# Patient Record
Sex: Female | Born: 1990 | Race: White | Hispanic: No
Health system: Southern US, Community
[De-identification: ages and names within clinical notes are randomized; demographics above are authoritative.]

## PROBLEM LIST (undated history)

## (undated) HISTORY — PX: CHOLECYSTECTOMY: SHX55

---
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
## 2015-05-21 ENCOUNTER — Encounter (HOSPITAL_COMMUNITY): Payer: Self-pay

## 2015-05-21 ENCOUNTER — Emergency Department (HOSPITAL_COMMUNITY): Payer: BLUE CROSS/BLUE SHIELD

## 2015-05-21 ENCOUNTER — Emergency Department (HOSPITAL_COMMUNITY)
Admission: EM | Admit: 2015-05-21 | Discharge: 2015-05-21 | Disposition: A | Discharge status: Home / Self Care | Payer: BLUE CROSS/BLUE SHIELD | Attending: Emergency Medicine | Admitting: Emergency Medicine

## 2015-05-21 DIAGNOSIS — Y92410 Unspecified street and highway as the place of occurrence of the external cause: Secondary | ICD-10-CM | POA: Insufficient documentation

## 2015-05-21 DIAGNOSIS — S161XXA Strain of muscle, fascia and tendon at neck level, initial encounter: Secondary | ICD-10-CM

## 2015-05-21 DIAGNOSIS — S199XXA Unspecified injury of neck, initial encounter: Secondary | ICD-10-CM | POA: Diagnosis present

## 2015-05-21 DIAGNOSIS — S39012A Strain of muscle, fascia and tendon of lower back, initial encounter: Secondary | ICD-10-CM | POA: Diagnosis not present

## 2015-05-21 DIAGNOSIS — Y998 Other external cause status: Secondary | ICD-10-CM | POA: Diagnosis not present

## 2015-05-21 DIAGNOSIS — S0990XA Unspecified injury of head, initial encounter: Secondary | ICD-10-CM | POA: Insufficient documentation

## 2015-05-21 DIAGNOSIS — Z3202 Encounter for pregnancy test, result negative: Secondary | ICD-10-CM | POA: Diagnosis not present

## 2015-05-21 DIAGNOSIS — Y9389 Activity, other specified: Secondary | ICD-10-CM | POA: Insufficient documentation

## 2015-05-21 LAB — POC URINE PREG, ED: Preg Test, Ur: NEGATIVE

## 2015-05-21 MED ORDER — HYDROCODONE-ACETAMINOPHEN 5-325 MG PO TABS
1.0000 | ORAL_TABLET | Freq: Once | ORAL | Status: DC
  Filled 2015-05-21: qty 1

## 2015-05-21 MED ORDER — TRAMADOL HCL 50 MG PO TABS: 50.0000 mg | ORAL_TABLET | Freq: Four times a day (QID) | ORAL | Status: DC | PRN

## 2015-05-21 MED ORDER — TRAMADOL HCL 50 MG PO TABS
50.0000 mg | ORAL_TABLET | Freq: Once | ORAL | Status: AC
  Administered 2015-05-21: 50 mg via ORAL
  Filled 2015-05-21: qty 1

## 2015-05-21 NOTE — ED Notes (Signed)
Pt took robaxin  around 1300 today.

## 2015-05-21 NOTE — Discharge Instructions (Signed)
Cervical Sprain °A cervical sprain is when the tissues (ligaments) that hold the neck bones in place stretch or tear. °HOME CARE  °· Put ice on the injured area. °· Put ice in a plastic bag. °· Place a towel between your skin and the bag. °· Leave the ice on for 15-20 minutes, 3-4 times a day. °· You may have been given a collar to wear. This collar keeps your neck from moving while you heal. °· Do not take the collar off unless told by your doctor. °· If you have long hair, keep it outside of the collar. °· Ask your doctor before changing the position of your collar. You may need to change its position over time to make it more comfortable. °· If you are allowed to take off the collar for cleaning or bathing, follow your doctor's instructions on how to do it safely. °· Keep your collar clean by wiping it with mild soap and water. Dry it completely. If the collar has removable pads, remove them every 1-2 days to hand wash them with soap and water. Allow them to air dry. They should be dry before you wear them in the collar. °· Do not drive while wearing the collar. °· Only take medicine as told by your doctor. °· Keep all doctor visits as told. °· Keep all physical therapy visits as told. °· Adjust your work station so that you have good posture while you work. °· Avoid positions and activities that make your problems worse. °· Warm up and stretch before being active. °GET HELP IF: °· Your pain is not controlled with medicine. °· You cannot take less pain medicine over time as planned. °· Your activity level does not improve as expected. °GET HELP RIGHT AWAY IF:  °· You are bleeding. °· Your stomach is upset. °· You have an allergic reaction to your medicine. °· You develop new problems that you cannot explain. °· You lose feeling (become numb) or you cannot move any part of your body (paralysis). °· You have tingling or weakness in any part of your body. °· Your symptoms get worse. Symptoms include: °· Pain,  soreness, stiffness, puffiness (swelling), or a burning feeling in your neck. °· Pain when your neck is touched. °· Shoulder or upper back pain. °· Limited ability to move your neck. °· Headache. °· Dizziness. °· Your hands or arms feel week, lose feeling, or tingle. °· Muscle spasms. °· Difficulty swallowing or chewing. °MAKE SURE YOU:  °· Understand these instructions. °· Will watch your condition. °· Will get help right away if you are not doing well or get worse. °  °This information is not intended to replace advice given to you by your health care provider. Make sure you discuss any questions you have with your health care provider. °  °Document Released: 10/04/2007 Document Revised: 12/18/2012 Document Reviewed: 10/23/2012 °Elsevier Interactive Patient Education ©2016 [employer] ° °Lumbosacral Strain °Lumbosacral strain is a strain of any of the parts that make up your lumbosacral vertebrae. Your lumbosacral vertebrae are the bones that make up the lower third of your backbone. Your lumbosacral vertebrae are held together by muscles and tough, fibrous tissue (ligaments).  °CAUSES  °A sudden blow to your back can cause lumbosacral strain. Also, anything that causes an excessive stretch of the muscles in the low back can cause this strain. This is typically seen when people exert themselves strenuously, fall, lift heavy objects, bend, or crouch repeatedly. °RISK FACTORS °· Physically demanding   work. °· Participation in pushing or pulling sports or sports that require a sudden twist of the back (tennis, golf, baseball). °· Weight lifting. °· Excessive lower back curvature. °· Forward-tilted pelvis. °· Weak back or abdominal muscles or both. °· Tight hamstrings. °SIGNS AND SYMPTOMS  °Lumbosacral strain may cause pain in the area of your injury or pain that moves (radiates) down your leg.  °DIAGNOSIS °Your health care provider can often diagnose lumbosacral strain through a physical exam. In some cases, you may  need tests such as X-ray exams.  °TREATMENT  °Treatment for your lower back injury depends on many factors that your clinician will have to evaluate. However, most treatment will include the use of anti-inflammatory medicines. °HOME CARE INSTRUCTIONS  °· Avoid hard physical activities (tennis, racquetball, waterskiing) if you are not in proper physical condition for it. This may aggravate or create problems. °· If you have a back problem, avoid sports requiring sudden body movements. Swimming and walking are generally safer activities. °· Maintain good posture. °· Maintain a healthy weight. °· For acute conditions, you may put ice on the injured area. °¨ Put ice in a plastic bag. °¨ Place a towel between your skin and the bag. °¨ Leave the ice on for 20 minutes, 2-3 times a day. °· When the low back starts healing, stretching and strengthening exercises may be recommended. °SEEK MEDICAL CARE IF: °· Your back pain is getting worse. °· You experience severe back pain not relieved with medicines. °SEEK IMMEDIATE MEDICAL CARE IF:  °· You have numbness, tingling, weakness, or problems with the use of your arms or legs. °· There is a change in bowel or bladder control. °· You have increasing pain in any area of the body, including your belly (abdomen). °· You notice shortness of breath, dizziness, or feel faint. °· You feel sick to your stomach (nauseous), are throwing up (vomiting), or become sweaty. °· You notice discoloration of your toes or legs, or your feet get very cold. °MAKE SURE YOU:  °· Understand these instructions. °· Will watch your condition. °· Will get help right away if you are not doing well or get worse. °  °This information is not intended to replace advice given to you by your health care provider. Make sure you discuss any questions you have with your health care provider. °  °Document Released: 01/25/2005 Document Revised: 05/08/2014 Document Reviewed: 12/04/2012 °Elsevier Interactive Patient  Education ©2016 Else[employer] Inc. ° °
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
## 2016-02-19 ENCOUNTER — Emergency Department (HOSPITAL_COMMUNITY)
Admission: EM | Admit: 2016-02-19 | Discharge: 2016-02-19 | Disposition: A | Discharge status: Home / Self Care | Payer: BLUE CROSS/BLUE SHIELD | Attending: Emergency Medicine | Admitting: Emergency Medicine

## 2016-02-19 ENCOUNTER — Encounter (HOSPITAL_COMMUNITY): Payer: Self-pay | Admitting: Emergency Medicine

## 2016-02-19 DIAGNOSIS — T148XXA Other injury of unspecified body region, initial encounter: Secondary | ICD-10-CM

## 2016-02-19 DIAGNOSIS — Y9389 Activity, other specified: Secondary | ICD-10-CM | POA: Insufficient documentation

## 2016-02-19 DIAGNOSIS — Y929 Unspecified place or not applicable: Secondary | ICD-10-CM | POA: Diagnosis not present

## 2016-02-19 DIAGNOSIS — S39012A Strain of muscle, fascia and tendon of lower back, initial encounter: Secondary | ICD-10-CM | POA: Insufficient documentation

## 2016-02-19 DIAGNOSIS — S3992XA Unspecified injury of lower back, initial encounter: Secondary | ICD-10-CM | POA: Diagnosis present

## 2016-02-19 DIAGNOSIS — X501XXA Overexertion from prolonged static or awkward postures, initial encounter: Secondary | ICD-10-CM | POA: Insufficient documentation

## 2016-02-19 DIAGNOSIS — M62830 Muscle spasm of back: Secondary | ICD-10-CM

## 2016-02-19 DIAGNOSIS — Y999 Unspecified external cause status: Secondary | ICD-10-CM | POA: Diagnosis not present

## 2016-02-19 MED ORDER — CYCLOBENZAPRINE HCL 10 MG PO TABS: 10.0000 mg | ORAL_TABLET | Freq: Two times a day (BID) | ORAL | 0 refills | Status: AC | PRN

## 2016-02-19 NOTE — ED Provider Notes (Signed)
[facility] Provider Note   CSN: [id number] Arrival date & time: 02/19/16  1607     History   Chief Complaint Chief Complaint  Patient presents with  . Back Pain    HPI Sheila Aguilar is a 25 y.o. female who presents to the ED with back pain. 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 Patient went to [facility] in [place] last night and was told she had a sprain of her back. They did not do x-rays. They did not give her any medications. They did not give her a work note and she was unable to work today. Patient reports that she needs a work note for being out today. Patient currently taking Cipro for a UTI but no other medications at this time.  The history is provided Sheila the patient. No language interpreter was used.  Back Pain   This is a new problem. The current episode started yesterday. The problem occurs constantly. The problem has been gradually worsening. The pain is associated with a recent injury. The pain is present in the thoracic spine and lumbar spine. Quality: sharp. The pain is at a severity of 9/10. The symptoms are aggravated Sheila bending and twisting. The pain is the same all the time. Pertinent negatives include no chest pain, no fever, no numbness, no abdominal pain, no bowel incontinence, no bladder incontinence, no dysuria, no paresis and no weakness. She has tried nothing for the symptoms.    History reviewed. No pertinent past medical history.  There are no active problems to display for this patient.   Past Surgical History:  Procedure Laterality Date  . CHOLECYSTECTOMY      OB History    No data available       Home Medications    Prior to Admission medications   Medication Sig Start Date End Date Taking? Authorizing Provider    cyclobenzaprine (FLEXERIL) 10 MG tablet Take 1 tablet (10 mg total) Sheila mouth 2 (two) times daily as needed for muscle spasms. 02/19/16   Hobie Kohles Orlene Och, NP    Family History Family History  Problem Relation Age of Onset  . Diabetes Mother   . Diabetes Father   . Hypertension Father     Social History Social History  Substance Use Topics  . Smoking status: Never Smoker  . Smokeless tobacco: Never Used  . Alcohol use No     Allergies   Codeine; Ibuprofen; Phenergan [promethazine hcl]; and Prilosec [omeprazole]   Review of Systems Review of Systems  Constitutional: Negative for chills and fever.  HENT: Negative.   Eyes: Negative for visual disturbance.  Respiratory: Negative for shortness of breath and wheezing.   Cardiovascular: Negative for chest pain.  Gastrointestinal: Negative for abdominal pain, bowel incontinence, nausea and vomiting.  Genitourinary: Negative for bladder incontinence, decreased urine volume, dysuria and frequency.  Musculoskeletal: Positive for arthralgias and back pain.  Skin: Negative for wound.  Neurological: Negative for weakness and numbness.  Psychiatric/Behavioral: Negative for confusion. The patient is not nervous/anxious.      Physical Exam Updated Vital Signs BP 129/79 (BP Location: Left Arm)   Pulse 96   Temp 97.7 F (36.5 C) (Oral)   Resp 16   Ht 5\' 8"  (1.727 m)  Wt (!) 143.8 kg   LMP 12/14/2015   SpO2 100%   BMI 48.20 kg/m   Physical Exam  Constitutional: She is oriented to person, place, and time. She appears well-developed and well-nourished. No distress.  HENT:  Head: Normocephalic and atraumatic.  Eyes: EOM are normal.  Neck: Normal range of motion. Neck supple.  Cardiovascular: Normal rate and regular rhythm.   Pulmonary/Chest: Effort normal and breath sounds normal.  Abdominal: Soft. There is no tenderness.  Musculoskeletal:  Generalized back pain. Spasm noted right lower. Pedal pulses 2+, adequate circulation.  Radial pulses 2+, grips equal.   Neurological: She is alert and oriented to person, place, and time. She has normal strength. No cranial nerve deficit or sensory deficit. Gait normal.  Reflex Scores:      Bicep reflexes are 2+ on the right side and 2+ on the left side.      Brachioradialis reflexes are 2+ on the right side and 2+ on the left side.      Patellar reflexes are 2+ on the right side and 2+ on the left side. Skin: Skin is warm and dry.  Psychiatric: She has a normal mood and affect. Her behavior is normal.  Nursing note and vitals reviewed.  Radiology No results found.  Procedures Procedures (including critical care time)  Medications Ordered in ED Medications - No data to display   Initial Impression / Assessment and Plan / ED Course  I have reviewed the triage vital signs and the nursing notes.  Clinical Course     Final Clinical Impressions(s) / ED Diagnoses  25 y.o. female with back pain s/p injury while lifting at work. Stable for d/c without neuro deficits. Will treat with muscle relaxant and she will f/u with her PCP or return here for worsening symptoms.  Final diagnoses:  Muscle spasm of back  Muscle strain    New Prescriptions New Prescriptions   CYCLOBENZAPRINE (FLEXERIL) 10 MG TABLET    Take 1 tablet (10 mg total) Sheila mouth 2 (two) times daily as needed for muscle spasms.     WarrentonHope M Jannatul Wojdyla, NP 02/19/16 1650    Marily MemosJason Mesner, MD 02/19/16 2030

## 2016-02-19 NOTE — Discharge Instructions (Signed)
Do not drive while taking the muscle relaxant as it will make you sleepy. °

## 2016-02-19 NOTE — ED Triage Notes (Signed)
Pt states she is having back pain "all the way down" and R arm pain after dumping water from pasta while cooking yesterday.

## 2016-02-21 ENCOUNTER — Encounter (HOSPITAL_COMMUNITY): Payer: Self-pay | Admitting: Emergency Medicine

## 2016-02-21 ENCOUNTER — Emergency Department (HOSPITAL_COMMUNITY)
Admission: EM | Admit: 2016-02-21 | Discharge: 2016-02-21 | Disposition: A | Discharge status: Home / Self Care | Payer: BLUE CROSS/BLUE SHIELD | Attending: Emergency Medicine | Admitting: Emergency Medicine

## 2016-02-21 DIAGNOSIS — Y990 Civilian activity done for income or pay: Secondary | ICD-10-CM | POA: Insufficient documentation

## 2016-02-21 DIAGNOSIS — X503XXA Overexertion from repetitive movements, initial encounter: Secondary | ICD-10-CM | POA: Diagnosis not present

## 2016-02-21 DIAGNOSIS — M545 Low back pain, unspecified: Secondary | ICD-10-CM

## 2016-02-21 DIAGNOSIS — Y929 Unspecified place or not applicable: Secondary | ICD-10-CM | POA: Insufficient documentation

## 2016-02-21 DIAGNOSIS — Y9389 Activity, other specified: Secondary | ICD-10-CM | POA: Diagnosis not present

## 2016-02-21 DIAGNOSIS — Z79899 Other long term (current) drug therapy: Secondary | ICD-10-CM | POA: Diagnosis not present

## 2016-02-21 DIAGNOSIS — R200 Anesthesia of skin: Secondary | ICD-10-CM | POA: Insufficient documentation

## 2016-02-21 MED ORDER — PREDNISONE 10 MG PO TABS: ORAL_TABLET | ORAL | 0 refills | Status: AC

## 2016-02-21 NOTE — ED Provider Notes (Signed)
[facility] Provider Note   CSN: [id number] Arrival date & time: 02/21/16  1734  By signing my name below, I, Sheila Aguilar, attest that this documentation has been prepared under the direction and in the presence of Langston MaskerKaren Jatorian Renault, PA-C Electronically Signed: Soijett Aguilar, ED Scribe. 02/21/16. 6:52 PM.   History   Chief Complaint Chief Complaint  Patient presents with  . Back Pain    HPI  Sheila Aguilar is a 25 y.o. female who presents to the Emergency Department complaining of bilateral lower back pain onset 2 days ago. 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 She reports that the lower back pain does radiate to her posterior bilateral legs and she describes the pain as a numbness sensation. Pt notes that she was seen in the ED several days ago for her symptoms and Rx flexeril with no relief of her symptoms. Pt denies back pain issues in the past. She states that she is having associated symptoms of numbness to bilateral legs. She states that she has tried Rx flexeril with no relief for her symptoms. Pt denies gait problem, color change, rash, wound, and any other symptoms. Denies PMHx of DM or HTN. [place]  Per pt chart review: Pt was seen in the ED on 02/19/2016 for lower back pain.  Pt was Rx flexeril for their symptoms.    The history is provided by the patient. No language interpreter was used.    History reviewed. No pertinent past medical history.  There are no active problems to display for this patient.   Past Surgical History:  Procedure Laterality Date  . CHOLECYSTECTOMY      OB History    No data available       Home Medications    Prior to Admission medications   Medication Sig Start Date End Date Taking? Authorizing Provider  cyclobenzaprine (FLEXERIL) 10 MG tablet Take 1 tablet (10 mg total) by mouth 2 (two) times daily as needed for muscle  spasms. 02/19/16   Hope Orlene OchM Neese, NP    Family History Family History  Problem Relation Age of Onset  . Diabetes Mother   . Diabetes Father   . Hypertension Father     Social History Social History  Substance Use Topics  . Smoking status: Never Smoker  . Smokeless tobacco: Never Used  . Alcohol use No     Allergies   Codeine; Ibuprofen; Phenergan [promethazine hcl]; and Prilosec [omeprazole]   Review of Systems Review of Systems  Musculoskeletal: Positive for back pain (lower).  Skin: Negative for color change, rash and wound.  Neurological: Positive for numbness (posterior bilateral legs).     Physical Exam Updated Vital Signs BP 132/80 (BP Location: Left Arm)   Pulse 95   Temp 98.1 F (36.7 C) (Oral)   Resp 16   Ht 5\' 8"  (1.727 m)   Wt (!) 317 lb (143.8 kg)   LMP 12/14/2015   SpO2 99%   BMI 48.20 kg/m   Physical Exam  Constitutional: She is oriented to person, place, and time. She appears well-developed and well-nourished. No distress.  HENT:  Head: Normocephalic and atraumatic.  Eyes: EOM are normal.  Neck: Neck supple.  Cardiovascular: Normal rate.   Pulmonary/Chest: Effort normal. No respiratory distress.  Abdominal: She exhibits no distension.  Musculoskeletal: Normal range of motion.  Lumbar back: She exhibits tenderness.  Diffusely tender lumbar spine.   Neurological: She is alert and oriented to person, place, and time.  Skin: Skin is warm and dry.  Psychiatric: She has a normal mood and affect. Her behavior is normal.  Nursing note and vitals reviewed.   ED Treatments / Results  DIAGNOSTIC STUDIES: Oxygen Saturation is 99% on RA, nl by my interpretation.    COORDINATION OF CARE: 6:51 PM Discussed treatment plan with pt at bedside which includes prednisone Rx and pt agreed to plan.   Procedures Procedures (including critical care time)  Medications Ordered in ED Medications - No data to display   Initial Impression /  Assessment and Plan / ED Course  I have reviewed the triage vital signs and the nursing notes.   Clinical Course      Final Clinical Impressions(s) / ED Diagnoses   Final diagnoses:  Low back pain without sciatica, unspecified back pain laterality, unspecified chronicity    New Prescriptions Discharge Medication List as of 02/21/2016  6:50 PM    START taking these medications   Details  predniSONE (DELTASONE) 10 MG tablet 6,5,4,3,2,1 taper, Print      An After Visit Summary was printed and given to the patient.    Sheila Skinner [place], PA-C 02/21/16 1910    Canary Brim Tegeler, MD 02/22/16 1115

## 2016-02-21 NOTE — ED Triage Notes (Addendum)
Pt reports bilateral lower back pain radiating down bilateral legs with numbness in bilateral legs since Friday.  Pt has been dx with a UTI last Tuesday.  Pt is taking antibiotics currently.  Pt denies n/v/d or urinary symptoms at this time.

## 2016-02-21 NOTE — ED Notes (Signed)
Pt states she was seen here for lower back pain Saturday. Since then, she began having leg pain and numbness down both legs.

## 2017-12-10 ENCOUNTER — Other Ambulatory Visit: Payer: Self-pay

## 2017-12-10 ENCOUNTER — Emergency Department (HOSPITAL_COMMUNITY)
Admission: EM | Admit: 2017-12-10 | Discharge: 2017-12-11 | Disposition: A | Discharge status: Home / Self Care | Payer: Self-pay | Attending: Emergency Medicine | Admitting: Emergency Medicine

## 2017-12-10 DIAGNOSIS — M545 Low back pain, unspecified: Secondary | ICD-10-CM

## 2017-12-10 NOTE — ED Triage Notes (Signed)
[facility] with [facility]. States that her back pain is not the result of anything other than a muscle. States that she is going to lose her job if she doesn't have a note from the provider.

## 2017-12-11 MED ORDER — CYCLOBENZAPRINE HCL 10 MG PO TABS: 10.0000 mg | ORAL_TABLET | Freq: Two times a day (BID) | ORAL | 0 refills | Status: AC | PRN

## 2017-12-11 MED ORDER — CYCLOBENZAPRINE HCL 10 MG PO TABS
10.0000 mg | ORAL_TABLET | Freq: Once | ORAL | Status: AC
  Administered 2017-12-11: 10 mg via ORAL
  Filled 2017-12-11: qty 1

## 2017-12-11 NOTE — ED Provider Notes (Signed)
[facility] Provider Note   CSN: [id number] Arrival date & time: 12/10/17  2206     History   Chief Complaint Chief Complaint  Patient presents with  . Back Pain    HPI Sheila Aguilar is a 27 y.o. female.  Patient presents with complaint of low back pain that is new for her and started 2 weeks ago. It is worse with movement and better when at rest. No radiation to LE's. No weakness or numbness. No urinary/bowel incontinence. She reports she was seen at [facility] when the symptoms started and she was diagnosed with UTI. She was having dysuria at the time, which persisted along with the back pain so she returned to [facility] earlier today and was told she had BV, "but they didn't listen when I told them about back pain", so she came here for treatment. No fever, nausea, vomiting. She works as a [employer] and does heavy lifting at work but is not aware of any specific injury.   The history is provided by the patient. No language interpreter was used.  Back Pain   Pertinent negatives include no chest pain, no fever, no numbness, no abdominal pain and no weakness.    No past medical history on file.  There are no active problems to display for this patient.      OB History   None      Home Medications    Prior to Admission medications   Not on File    Family History No family history on file.  Social History Social History   Tobacco Use  . Smoking status: Not on file  Substance Use Topics  . Alcohol use: Not on file  . Drug use: Not on file     Allergies   Tylenol [acetaminophen]   Review of Systems Review of Systems  Constitutional: Negative for chills and fever.  Respiratory: Negative.  Negative for shortness of breath.   Cardiovascular: Negative.  Negative for chest pain.  Gastrointestinal: Negative.  Negative for abdominal pain and nausea.  Musculoskeletal: Positive for back pain.  Skin: Negative.    Neurological: Negative.  Negative for weakness and numbness.     Physical Exam Updated Vital Signs BP 123/70 (BP Location: Left Arm)   Pulse 71   Temp 99.1 F (37.3 C) (Oral)   Resp 18   Ht 5\' 8"  (1.727 m)   Wt (!) 146.5 kg   LMP 10/09/2017 (Exact Date)   SpO2 100%   BMI 49.11 kg/m   Physical Exam  Constitutional: She is oriented to person, place, and time. She appears well-developed and well-nourished.  HENT:  Head: Normocephalic.  Neck: Normal range of motion. Neck supple.  Cardiovascular: Normal rate.  Pulmonary/Chest: Effort normal.  Abdominal: Soft. Bowel sounds are normal. There is no tenderness. There is no rebound and no guarding.  Musculoskeletal: Normal range of motion.       Lumbar back: She exhibits tenderness. She exhibits normal range of motion.       Back:  Neurological: She is alert and oriented to person, place, and time. No sensory deficit. She exhibits normal muscle tone. Coordination normal.  Skin: Skin is warm and dry. No rash noted.  Psychiatric: She has a normal mood and affect.     ED Treatments / Results  Labs (all labs ordered are listed, but only abnormal results are displayed) Labs Reviewed - No data to display  EKG None  Radiology No results found.  Procedures  Procedures (including critical care time)  Medications Ordered in ED Medications - No data to display   Initial Impression / Assessment and Plan / ED Course  I have reviewed the triage vital signs and the nursing notes.  Pertinent labs & imaging results that were available during my care of the patient were reviewed by me and considered in my medical decision making (see chart for details).     Patient here for evaluation of low back pain x 2 weeks. No known injury. No neurologic deficits. Symptoms follow musculoskeletal pattern.   She reports ibuprofen as an allergy, "it makes my throat swell" but states she take Naproxen for headaches without symptoms.   She is  encouraged to take Naproxen regularly for the next 7 days along with flexeril Rx provided tonight. F/U PCP if symptoms persist.   Final Clinical Impressions(s) / ED Diagnoses   Final diagnoses:  None   1. Low back pain  ED Discharge Orders    None       Elpidio AnisUpstill, Capri Veals, Cordelia Poche-C 12/11/17 0341    Palumbo, April, MD 12/11/17 0345

## 2017-12-11 NOTE — Discharge Instructions (Addendum)
Recommend getting established with a primary care physician for management of routine health. Take Naproxen regularly and Flexeril 3 times daily for low back pain.

## 2018-01-14 ENCOUNTER — Emergency Department (HOSPITAL_COMMUNITY)
Admission: EM | Admit: 2018-01-14 | Discharge: 2018-01-14 | Disposition: A | Discharge status: Home / Self Care | Payer: Medicaid - Out of State | Attending: Emergency Medicine | Admitting: Emergency Medicine

## 2018-01-14 ENCOUNTER — Encounter (HOSPITAL_COMMUNITY): Payer: Self-pay | Admitting: *Deleted

## 2018-01-14 ENCOUNTER — Emergency Department (HOSPITAL_COMMUNITY): Payer: Medicaid - Out of State

## 2018-01-14 ENCOUNTER — Other Ambulatory Visit: Payer: Self-pay

## 2018-01-14 DIAGNOSIS — R1031 Right lower quadrant pain: Secondary | ICD-10-CM | POA: Diagnosis present

## 2018-01-14 DIAGNOSIS — M545 Low back pain, unspecified: Secondary | ICD-10-CM

## 2018-01-14 LAB — URINALYSIS, ROUTINE W REFLEX MICROSCOPIC
Bilirubin Urine: NEGATIVE
Glucose, UA: NEGATIVE mg/dL
Hgb urine dipstick: NEGATIVE
Ketones, ur: NEGATIVE mg/dL
Leukocytes, UA: NEGATIVE
Nitrite: NEGATIVE
Protein, ur: NEGATIVE mg/dL
Specific Gravity, Urine: 1.018 (ref 1.005–1.030)
pH: 6 (ref 5.0–8.0)

## 2018-01-14 LAB — COMPREHENSIVE METABOLIC PANEL
ALT: 24 U/L (ref 0–44)
AST: 28 U/L (ref 15–41)
Albumin: 3.7 g/dL (ref 3.5–5.0)
Alkaline Phosphatase: 87 U/L (ref 38–126)
Anion gap: 11 (ref 5–15)
BUN: 11 mg/dL (ref 6–20)
CO2: 23 mmol/L (ref 22–32)
Calcium: 9.9 mg/dL (ref 8.9–10.3)
Chloride: 106 mmol/L (ref 98–111)
Creatinine, Ser: 0.72 mg/dL (ref 0.44–1.00)
GFR calc Af Amer: >60 mL/min (ref >60)
GFR calc non Af Amer: >60 mL/min (ref >60)
Glucose, Bld: 87 mg/dL (ref 70–99)
Potassium: 4.6 mmol/L (ref 3.5–5.1)
Sodium: 140 mmol/L (ref 135–145)
Total Bilirubin: 0.6 mg/dL (ref 0.3–1.2)
Total Protein: 7 g/dL (ref 6.5–8.1)

## 2018-01-14 LAB — CBC
HCT: 42.9 % (ref 36.0–46.0)
Hemoglobin: 13.7 g/dL (ref 12.0–15.0)
MCH: 28 pg (ref 26.0–34.0)
MCHC: 31.9 g/dL (ref 30.0–36.0)
MCV: 87.6 fL (ref 78.0–100.0)
Platelets: 283 10*3/uL (ref 150–400)
RBC: 4.9 MIL/uL (ref 3.87–5.11)
RDW: 12.6 % (ref 11.5–15.5)
WBC: 8.6 10*3/uL (ref 4.0–10.5)

## 2018-01-14 LAB — I-STAT BETA HCG BLOOD, ED (MC, WL, AP ONLY): I-stat hCG, quantitative: <5 m[IU]/mL (ref <5)

## 2018-01-14 LAB — LIPASE, BLOOD: Lipase: 38 U/L (ref 11–51)

## 2018-01-14 MED ORDER — IOHEXOL 300 MG/ML  SOLN
100.0000 mL | Freq: Once | INTRAMUSCULAR | Status: AC | PRN
  Administered 2018-01-14: 100 mL via INTRAVENOUS

## 2018-01-14 NOTE — Discharge Instructions (Addendum)
Please read attached information. If you experience any new or worsening signs or symptoms please return to the emergency room for evaluation. Please follow-up with your primary care provider or specialist as discussed.  °

## 2018-01-14 NOTE — ED Triage Notes (Signed)
Pt in c/o right flank pain for the last week, also frequent urination, n/v after eating anything, pt also reports noting blood when she is trying to have a bowel movement, last normal BM was two weeks ago and she has been straining, now is only seeing bright red blood in the toilet when trying to have a BM, no distress noted

## 2018-01-14 NOTE — ED Provider Notes (Signed)
[facility] EMERGENCY DEPARTMENT Provider Note   CSN: [id number] Arrival date & time: 01/14/18  1342     History   Chief Complaint Chief Complaint  Patient presents with  . Flank Pain    HPI Sheila Aguilar is a 27 y.o. female.  HPI   27 year old female presents today with complaints of right-sided abdominal and flank pain.  Patient notes symptoms started 1 week ago with pain in her right lower back and right lower quadrant.  She notes frequent urinations, nausea and vomiting after eating.  Patient notes no burning with urination or change in color in her urine.  She notes some minor blood with bowel movements, no change in color of stool.  Patient denies any fever at home.  She notes she has been seen in outside emergency room with no significant abnormalities, and was diagnosed with musculoskeletal back pain and discharged with muscle relaxers.  She notes she tried muscle relaxers once without symptomatic improvement.  She notes continued pain in the lower lumbar musculature as well.  No radiation in the lower extremities.  History reviewed. No pertinent past medical history.  There are no active problems to display for this patient.   Past Surgical History:  Procedure Laterality Date  . CHOLECYSTECTOMY       OB History   None      Home Medications    Prior to Admission medications   Medication Sig Start Date End Date Taking? Authorizing Provider  cyclobenzaprine (FLEXERIL) 10 MG tablet Take 1 tablet (10 mg total) by mouth 2 (two) times daily as needed for muscle spasms. 12/11/17   Elpidio Anis, PA-C    Family History History reviewed. No pertinent family history.  Social History Social History   Tobacco Use  . Smoking status: Never Smoker  . Smokeless tobacco: Never Used  Substance Use Topics  . Alcohol use: Not on file  . Drug use: Not on file     Allergies   Tylenol [acetaminophen]   Review of Systems Review of Systems  All  other systems reviewed and are negative.   Physical Exam Updated Vital Signs BP (!) 147/70   Pulse 76   Temp 98 F (36.7 C) (Oral)   Resp 20   SpO2 100%   Physical Exam  Constitutional: She is oriented to person, place, and time. She appears well-developed and well-nourished.  HENT:  Head: Normocephalic and atraumatic.  Eyes: Pupils are equal, round, and reactive to light. Conjunctivae are normal. Right eye exhibits no discharge. Left eye exhibits no discharge. No scleral icterus.  Neck: Normal range of motion. No JVD present. No tracheal deviation present.  Pulmonary/Chest: Effort normal. No stridor.  Abdominal: Soft. She exhibits no distension and no mass. There is no tenderness. There is no rebound and no guarding. No hernia.  Musculoskeletal:  Tenderness palpation of the right lower lumbar musculature  Neurological: She is alert and oriented to person, place, and time. Coordination normal.  Psychiatric: She has a normal mood and affect. Her behavior is normal. Judgment and thought content normal.  Nursing note and vitals reviewed.    ED Treatments / Results  Labs (all labs ordered are listed, but only abnormal results are displayed) Labs Reviewed  URINALYSIS, ROUTINE W REFLEX MICROSCOPIC - Abnormal; Notable for the following components:      Result Value   APPearance HAZY (*)    All other components within normal limits  LIPASE, BLOOD  COMPREHENSIVE METABOLIC PANEL  CBC  I-STAT BETA  HCG BLOOD, ED ([facility], [facility], [facility] ONLY)    EKG None  Radiology Ct Abdomen Pelvis W Contrast  Result Date: 01/14/2018 CLINICAL DATA:  27 year old female with 1 week of right flank pain, frequent urination. EXAM: CT ABDOMEN AND PELVIS WITH CONTRAST TECHNIQUE: Multidetector CT imaging of the abdomen and pelvis was performed using the standard protocol following bolus administration of intravenous contrast. CONTRAST:  100mL OMNIPAQUE IOHEXOL 300 MG/ML  SOLN COMPARISON:  None. FINDINGS: Lower chest:  Negative.  No pericardial or pleural effusion. Hepatobiliary: Probable hepatic steatosis but otherwise negative liver. Surgically absent gallbladder. No biliary ductal enlargement. Pancreas: Negative. Spleen: Negative. Adrenals/Urinary Tract: Normal adrenal glands. Symmetric bilateral renal enhancement with no nephrolithiasis, hydronephrosis or perinephric stranding. Symmetric and normal proximal ureters. Unremarkable urinary bladder. Small pelvic phleboliths, more numerous on the left. Stomach/Bowel: Negative descending and rectosigmoid colon aside from some retained stool. Similar mild retained stool in the transverse colon. Negative right colon. Normal appendix suspected on coronal image 99. Negative terminal ileum. No pericecal inflammation. No dilated small bowel. Negative stomach and duodenum. No mesenteric inflammation. No abdominal free air, free fluid. Vascular/Lymphatic: Suboptimal intravascular contrast bolus but the major arterial structures in the abdomen and pelvis appear patent. The portal venous system also appears patent. No lymphadenopathy. Reproductive: Negative. Other: No pelvic free fluid. Musculoskeletal: Age advanced lumbar spine disc and endplate degeneration. No acute osseous abnormality identified. IMPRESSION: Surgically absent gallbladder, otherwise negative CT Abdomen and Pelvis. Normal appendix. No urologic calculus or inflammatory process identified. Electronically Signed   By: Odessa FlemingH  Hall M.D.   On: 01/14/2018 20:28    Procedures Procedures (including critical care time)  Medications Ordered in ED Medications  iohexol (OMNIPAQUE) 300 MG/ML solution 100 mL (100 mLs Intravenous Contrast Given 01/14/18 1955)     Initial Impression / Assessment and Plan / ED Course  I have reviewed the triage vital signs and the nursing notes.  Pertinent labs & imaging results that were available during my care of the patient were reviewed by me and considered in my medical decision making (see  chart for details).     Labs: UA, I stat beta HCG, Lipase, CMP, CBC  Imaging: CT renal  Consults:  Therapeutics:  Discharge Meds:   Assessment/Plan: 27 year old female presents today with complaints of right low back and flank pain.  Patient has been seen several times for this, CT scan reassuring with no acute abnormalities labs reassuring, likely musculoskeletal back pain.  No red flags discharged with symptomatic care, strict return precautions, she verbalized understanding and agreement to today's plan had no further questions or concerns the time discharge.    Final Clinical Impressions(s) / ED Diagnoses   Final diagnoses:  Acute right-sided low back pain without sciatica    ED Discharge Orders    None       Eyvonne MechanicHedges, Iren Whipp, PA-C 01/15/18 1440    Sabas SousBero, Michael M, MD 01/15/18 1640

## 2018-01-14 NOTE — ED Notes (Signed)
Patient verbalizes understanding of discharge instructions. Opportunity for questioning and answers were provided. Armband removed by staff, pt discharged from ED ambulatory with mother.  

## 2019-09-14 IMAGING — CT CT ABD-PELV W/ CM
2 of 4 series · 16 of 46 positions shown, 18 images · IV contrast (Omni 300)
Comparison: None.

CLINICAL DATA: 27-year-old female with 1 week of right flank pain,
frequent urination.

EXAM:
CT ABDOMEN AND PELVIS WITH CONTRAST
TECHNIQUE: Multidetector CT imaging of the abdomen and pelvis was performed
using the standard protocol following bolus administration of
intravenous contrast.
CONTRAST:  100mL OMNIPAQUE IOHEXOL 300 MG/ML  SOLN

[Series 5: a/p w/ cor · coronal · 0.99mm/px · 3 of 195 slices shown]
[im 65/195  soft-tissue]
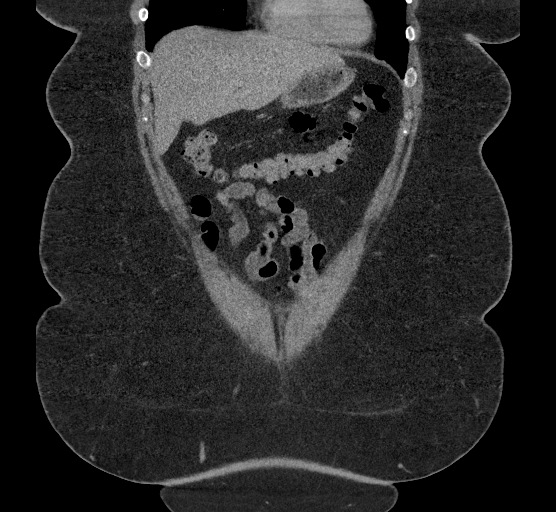
[im 87/195  soft-tissue]
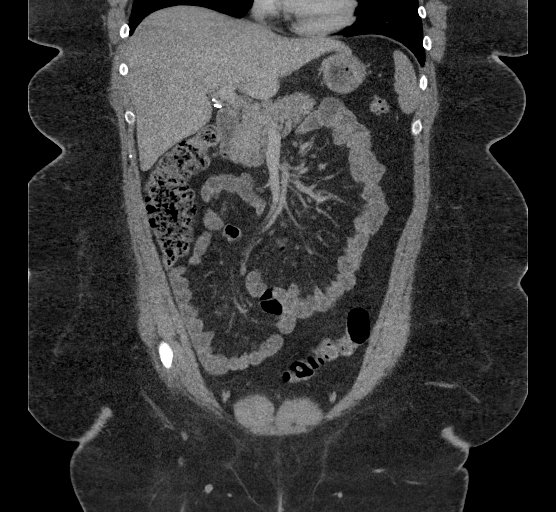
[im 108/195  soft-tissue]
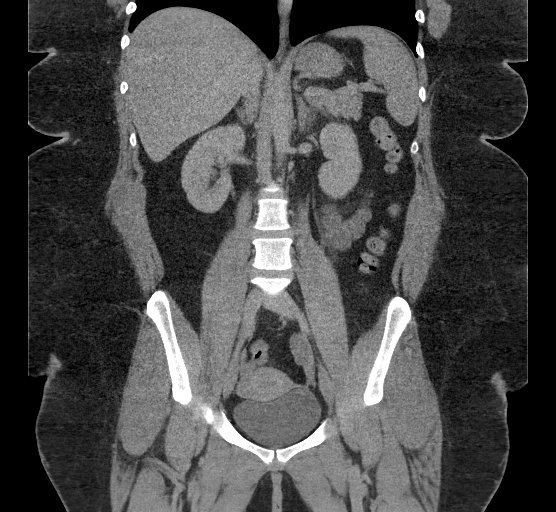

[Series 7: a/p w/ 5mm · axial · 0.98mm/px · z∈[+716,+1166]mm · 13 of 102 slices shown, 15 images]
[im 8/102  soft-tissue]
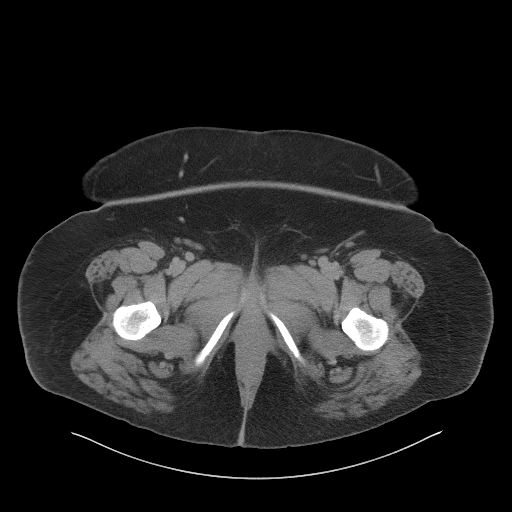
[im 8/102  bone]
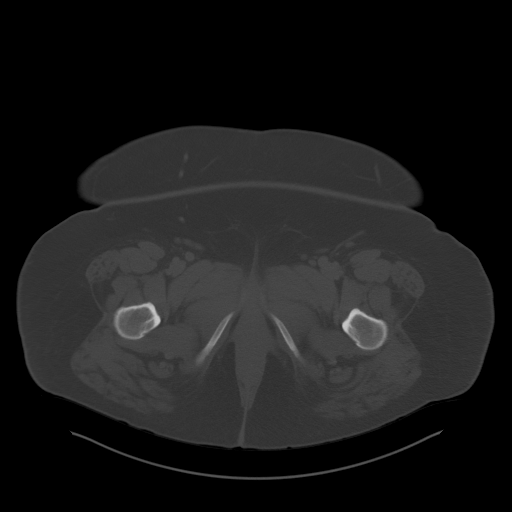
[im 15/102  soft-tissue]
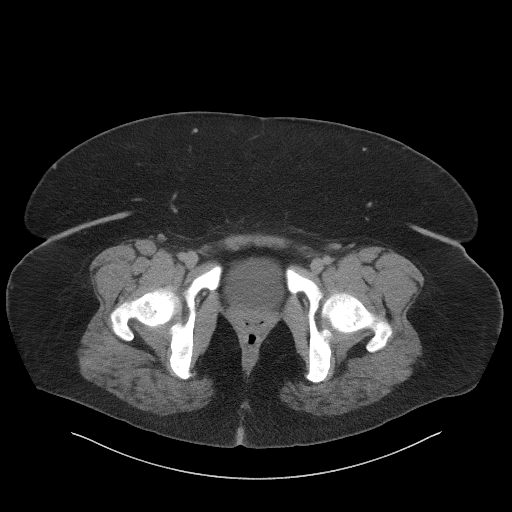
[im 23/102  soft-tissue]
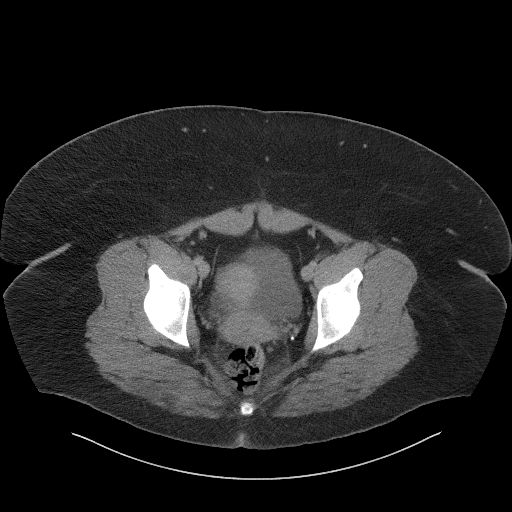
[im 30/102  soft-tissue]
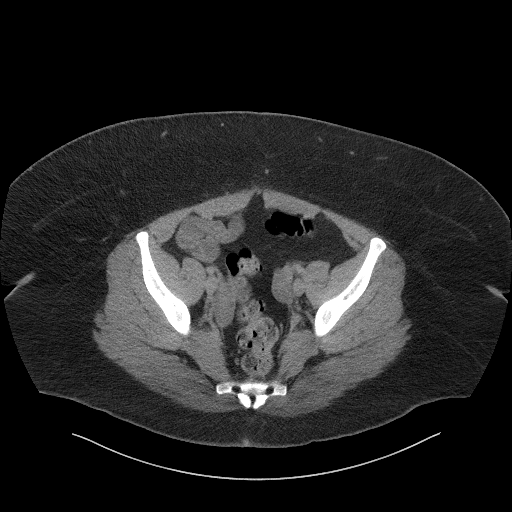
[im 38/102  soft-tissue]
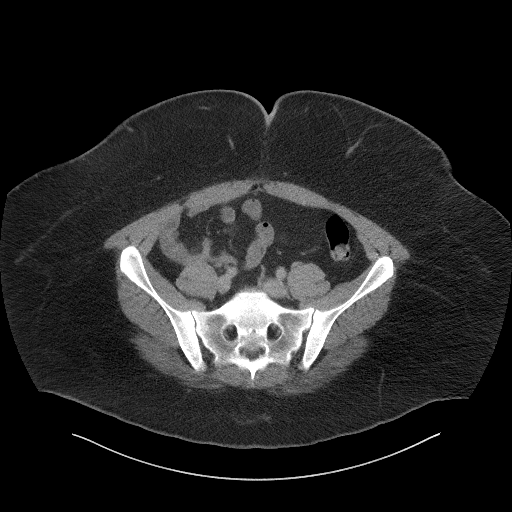
[im 45/102  soft-tissue]
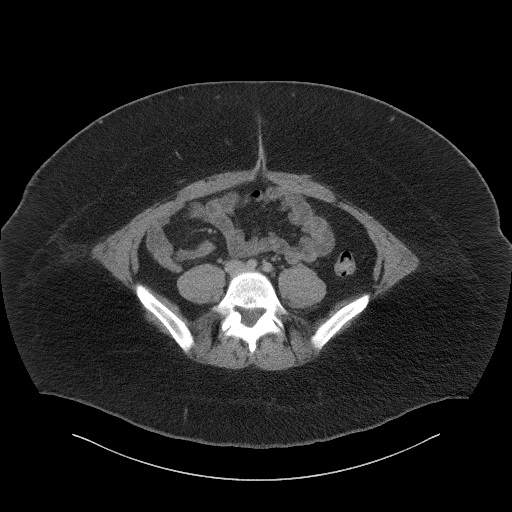
[im 53/102  soft-tissue]
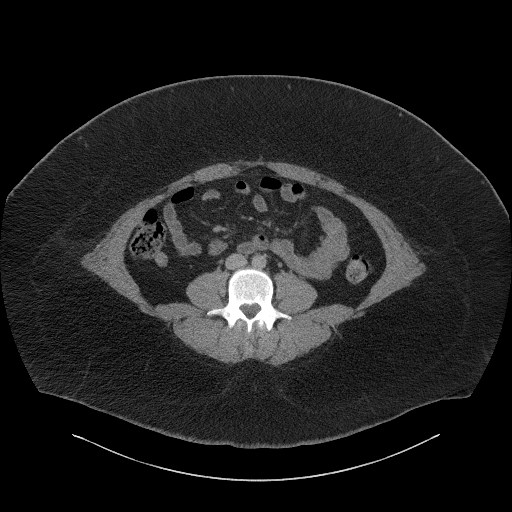
[im 60/102  soft-tissue]
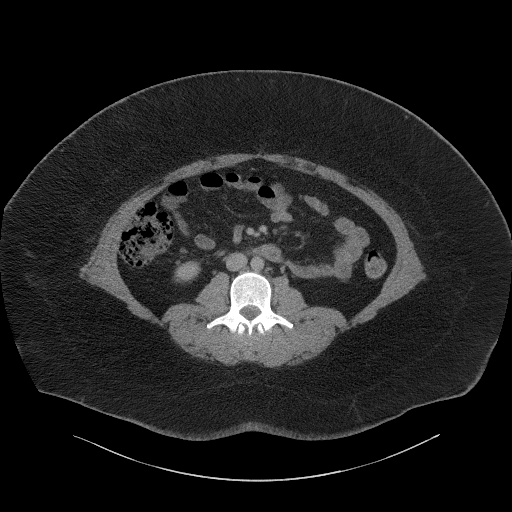
[im 68/102  soft-tissue]
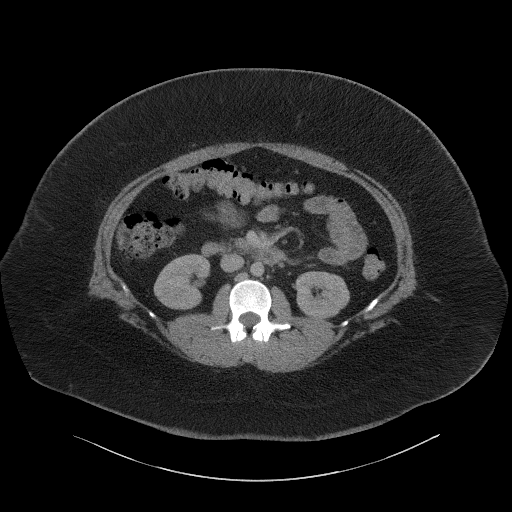
[im 68/102  bone]
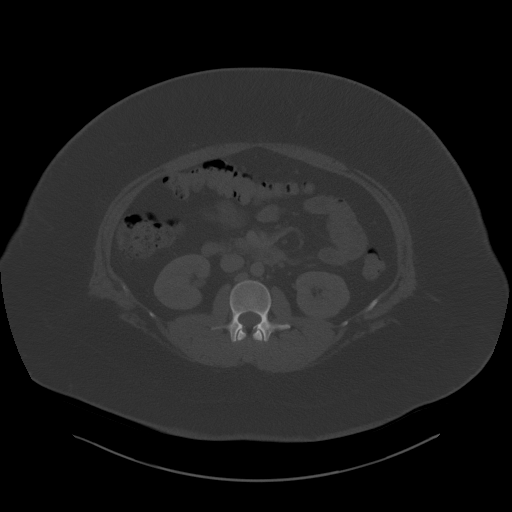
[im 75/102  soft-tissue]
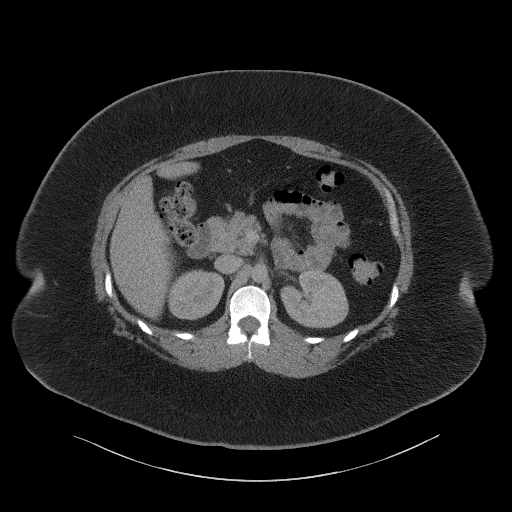
[im 83/102  soft-tissue]
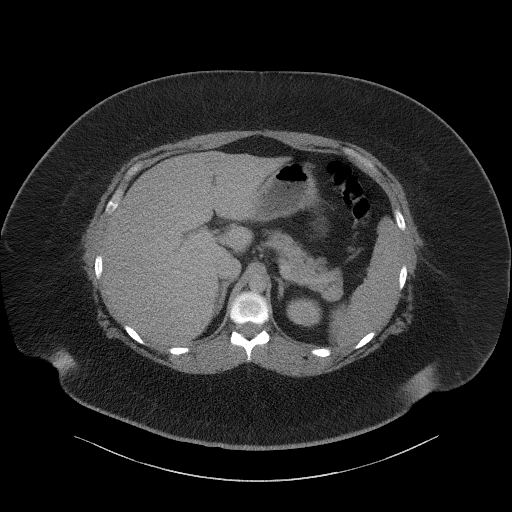
[im 90/102  soft-tissue]
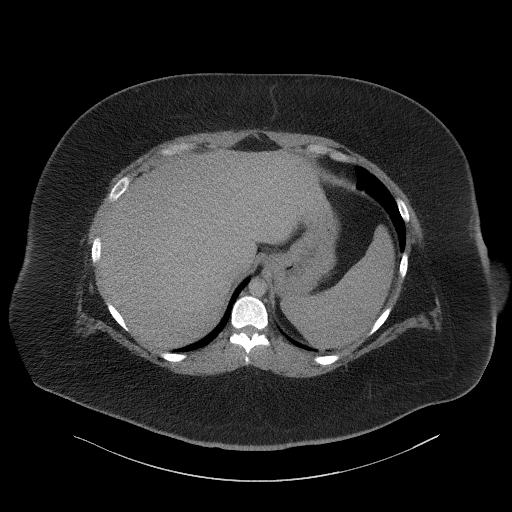
[im 98/102  soft-tissue]
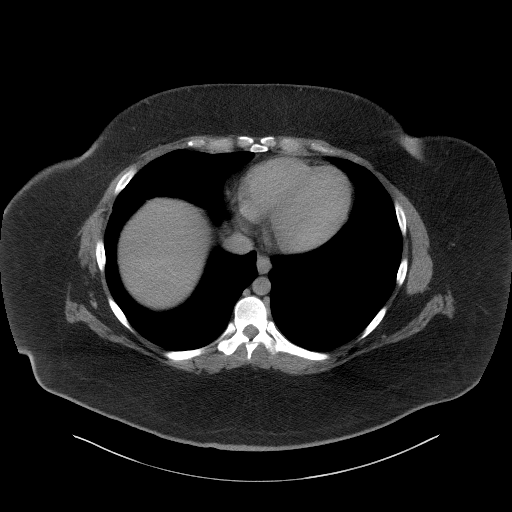

[16 of 46 positions shown; findings below may reference images not displayed]

FINDINGS: Lower chest: Negative.  No pericardial or pleural effusion.

Hepatobiliary: Probable hepatic steatosis but otherwise negative
liver. Surgically absent gallbladder. No biliary ductal enlargement.

Pancreas: Negative.

Spleen: Negative.

Adrenals/Urinary Tract: Normal adrenal glands.

Symmetric bilateral renal enhancement with no nephrolithiasis,
hydronephrosis or perinephric stranding. Symmetric and normal
proximal ureters.

Unremarkable urinary bladder.

Small pelvic phleboliths, more numerous on the left.

Stomach/Bowel: Negative descending and rectosigmoid colon aside from
some retained stool. Similar mild retained stool in the transverse
colon. Negative right colon.

Normal appendix suspected on coronal image 99. Negative terminal
ileum. No pericecal inflammation.

No dilated small bowel. Negative stomach and duodenum. No mesenteric
inflammation. No abdominal free air, free fluid.

Vascular/Lymphatic: Suboptimal intravascular contrast bolus but the
major arterial structures in the abdomen and pelvis appear patent.
The portal venous system also appears patent.

No lymphadenopathy.

Reproductive: Negative.

Other: No pelvic free fluid.

Musculoskeletal: Age advanced lumbar spine disc and endplate
degeneration. No acute osseous abnormality identified.
IMPRESSION: Surgically absent gallbladder, otherwise negative CT Abdomen and
Pelvis.

Normal appendix. No urologic calculus or inflammatory process
identified.

## 2020-07-22 ENCOUNTER — Encounter (HOSPITAL_COMMUNITY): Payer: Self-pay | Admitting: Emergency Medicine

## 2024-03-20 ENCOUNTER — Other Ambulatory Visit: Payer: Self-pay | Admitting: Internal Medicine

## 2024-03-20 DIAGNOSIS — N644 Mastodynia: Secondary | ICD-10-CM
